# Patient Record
Sex: Female | Born: 1960 | Race: White | Hispanic: No | Marital: Married | Smoking: Former smoker
Health system: Southern US, Community
[De-identification: ages and names within clinical notes are randomized; demographics above are authoritative.]

## PROBLEM LIST (undated history)

## (undated) DIAGNOSIS — M1990 Unspecified osteoarthritis, unspecified site: Secondary | ICD-10-CM

## (undated) DIAGNOSIS — M5126 Other intervertebral disc displacement, lumbar region: Secondary | ICD-10-CM

## (undated) DIAGNOSIS — R002 Palpitations: Secondary | ICD-10-CM

## (undated) DIAGNOSIS — K2970 Gastritis, unspecified, without bleeding: Secondary | ICD-10-CM

## (undated) DIAGNOSIS — K635 Polyp of colon: Secondary | ICD-10-CM

## (undated) DIAGNOSIS — K219 Gastro-esophageal reflux disease without esophagitis: Secondary | ICD-10-CM

## (undated) DIAGNOSIS — E669 Obesity, unspecified: Secondary | ICD-10-CM

## (undated) DIAGNOSIS — Z6826 Body mass index (BMI) 26.0-26.9, adult: Secondary | ICD-10-CM

## (undated) DIAGNOSIS — R0781 Pleurodynia: Secondary | ICD-10-CM

## (undated) DIAGNOSIS — K589 Irritable bowel syndrome without diarrhea: Secondary | ICD-10-CM

## (undated) DIAGNOSIS — R609 Edema, unspecified: Secondary | ICD-10-CM

## (undated) HISTORY — PX: SPINE SURGERY: SHX786

## (undated) HISTORY — DX: Gastro-esophageal reflux disease without esophagitis: K21.9

## (undated) HISTORY — DX: Polyp of colon: K63.5

## (undated) HISTORY — PX: APPENDECTOMY: SHX54

## (undated) HISTORY — DX: Obesity, unspecified: E66.9

## (undated) HISTORY — PX: FOOT SURGERY: SHX648

## (undated) HISTORY — DX: Unspecified osteoarthritis, unspecified site: M19.90

## (undated) HISTORY — DX: Other intervertebral disc displacement, lumbar region: M51.26

## (undated) HISTORY — DX: Irritable bowel syndrome, unspecified: K58.9

## (undated) HISTORY — DX: Body mass index (BMI) 26.0-26.9, adult: Z68.26

## (undated) HISTORY — DX: Palpitations: R00.2

## (undated) HISTORY — DX: Edema, unspecified: R60.9

## (undated) HISTORY — DX: Pleurodynia: R07.81

## (undated) HISTORY — DX: Gastritis, unspecified, without bleeding: K29.70

## (undated) HISTORY — PX: BREAST CYST ASPIRATION: SHX578

---

## 1998-03-09 ENCOUNTER — Ambulatory Visit (HOSPITAL_COMMUNITY): Admission: RE | Admit: 1998-03-09 | Discharge: 1998-03-09 | Payer: Self-pay | Admitting: Neurosurgery

## 1998-03-09 ENCOUNTER — Encounter: Payer: Self-pay | Admitting: Neurosurgery

## 1999-12-21 ENCOUNTER — Other Ambulatory Visit: Admission: RE | Admit: 1999-12-21 | Discharge: 1999-12-21 | Payer: Self-pay | Admitting: Obstetrics and Gynecology

## 2000-07-10 ENCOUNTER — Ambulatory Visit (HOSPITAL_COMMUNITY): Admission: RE | Admit: 2000-07-10 | Discharge: 2000-07-10 | Payer: Self-pay | Admitting: Obstetrics and Gynecology

## 2000-07-10 ENCOUNTER — Encounter: Payer: Self-pay | Admitting: Obstetrics and Gynecology

## 2000-08-20 ENCOUNTER — Emergency Department (HOSPITAL_COMMUNITY): Admission: EM | Admit: 2000-08-20 | Discharge: 2000-08-20 | Payer: Self-pay | Admitting: Emergency Medicine

## 2001-01-03 ENCOUNTER — Other Ambulatory Visit: Admission: RE | Admit: 2001-01-03 | Discharge: 2001-01-03 | Payer: Self-pay | Admitting: Obstetrics and Gynecology

## 2002-06-12 ENCOUNTER — Encounter: Admission: RE | Admit: 2002-06-12 | Discharge: 2002-06-12 | Payer: Self-pay | Admitting: Obstetrics and Gynecology

## 2002-06-12 ENCOUNTER — Encounter: Payer: Self-pay | Admitting: Obstetrics and Gynecology

## 2002-07-31 ENCOUNTER — Encounter: Admission: RE | Admit: 2002-07-31 | Discharge: 2002-07-31 | Payer: Self-pay | Admitting: Obstetrics and Gynecology

## 2002-07-31 ENCOUNTER — Encounter: Payer: Self-pay | Admitting: Obstetrics and Gynecology

## 2003-04-01 ENCOUNTER — Encounter: Admission: RE | Admit: 2003-04-01 | Discharge: 2003-04-01 | Payer: Self-pay | Admitting: Family Medicine

## 2004-06-21 ENCOUNTER — Encounter: Admission: RE | Admit: 2004-06-21 | Discharge: 2004-06-21 | Payer: Self-pay | Admitting: Obstetrics and Gynecology

## 2005-08-18 ENCOUNTER — Encounter: Admission: RE | Admit: 2005-08-18 | Discharge: 2005-08-18 | Payer: Self-pay | Admitting: Family Medicine

## 2006-06-12 ENCOUNTER — Encounter: Admission: RE | Admit: 2006-06-12 | Discharge: 2006-06-12 | Payer: Self-pay | Admitting: Obstetrics and Gynecology

## 2007-05-20 ENCOUNTER — Encounter: Admission: RE | Admit: 2007-05-20 | Discharge: 2007-05-20 | Payer: Self-pay | Admitting: Family Medicine

## 2008-06-09 ENCOUNTER — Encounter: Admission: RE | Admit: 2008-06-09 | Discharge: 2008-06-09 | Payer: Self-pay | Admitting: Obstetrics and Gynecology

## 2010-05-12 ENCOUNTER — Other Ambulatory Visit: Payer: Self-pay | Admitting: Obstetrics and Gynecology

## 2010-05-12 DIAGNOSIS — Z1231 Encounter for screening mammogram for malignant neoplasm of breast: Secondary | ICD-10-CM

## 2010-06-14 ENCOUNTER — Ambulatory Visit
Admission: RE | Admit: 2010-06-14 | Discharge: 2010-06-14 | Disposition: A | Discharge status: Home / Self Care | Payer: BLUE CROSS/BLUE SHIELD | Source: Ambulatory Visit | Attending: Obstetrics and Gynecology | Admitting: Obstetrics and Gynecology

## 2010-06-14 DIAGNOSIS — Z1231 Encounter for screening mammogram for malignant neoplasm of breast: Secondary | ICD-10-CM

## 2011-05-24 ENCOUNTER — Other Ambulatory Visit: Payer: Self-pay | Admitting: Obstetrics and Gynecology

## 2011-05-24 DIAGNOSIS — Z1231 Encounter for screening mammogram for malignant neoplasm of breast: Secondary | ICD-10-CM

## 2011-06-15 ENCOUNTER — Ambulatory Visit
Admission: RE | Admit: 2011-06-15 | Discharge: 2011-06-15 | Disposition: A | Discharge status: Home / Self Care | Payer: BC Managed Care – PPO | Source: Ambulatory Visit | Attending: Obstetrics and Gynecology | Admitting: Obstetrics and Gynecology

## 2011-06-15 DIAGNOSIS — Z1231 Encounter for screening mammogram for malignant neoplasm of breast: Secondary | ICD-10-CM

## 2012-05-13 ENCOUNTER — Other Ambulatory Visit: Payer: Self-pay

## 2012-05-13 DIAGNOSIS — Z1231 Encounter for screening mammogram for malignant neoplasm of breast: Secondary | ICD-10-CM

## 2012-06-18 ENCOUNTER — Ambulatory Visit: Payer: BC Managed Care – PPO

## 2012-07-02 ENCOUNTER — Ambulatory Visit
Admission: RE | Admit: 2012-07-02 | Discharge: 2012-07-02 | Disposition: A | Discharge status: Home / Self Care | Payer: BC Managed Care – PPO | Source: Ambulatory Visit

## 2012-07-02 DIAGNOSIS — Z1231 Encounter for screening mammogram for malignant neoplasm of breast: Secondary | ICD-10-CM

## 2013-05-28 ENCOUNTER — Other Ambulatory Visit: Payer: Self-pay

## 2013-05-28 DIAGNOSIS — Z1231 Encounter for screening mammogram for malignant neoplasm of breast: Secondary | ICD-10-CM

## 2013-07-08 ENCOUNTER — Encounter (INDEPENDENT_AMBULATORY_CARE_PROVIDER_SITE_OTHER): Payer: Self-pay

## 2013-07-08 ENCOUNTER — Ambulatory Visit
Admission: RE | Admit: 2013-07-08 | Discharge: 2013-07-08 | Disposition: A | Discharge status: Home / Self Care | Payer: BC Managed Care – PPO | Source: Ambulatory Visit

## 2013-07-08 DIAGNOSIS — Z1231 Encounter for screening mammogram for malignant neoplasm of breast: Secondary | ICD-10-CM

## 2014-08-13 ENCOUNTER — Ambulatory Visit: Payer: Self-pay | Admitting: Podiatry

## 2015-01-14 ENCOUNTER — Other Ambulatory Visit: Payer: Self-pay

## 2015-01-14 DIAGNOSIS — Z1231 Encounter for screening mammogram for malignant neoplasm of breast: Secondary | ICD-10-CM

## 2015-02-09 ENCOUNTER — Ambulatory Visit
Admission: RE | Admit: 2015-02-09 | Discharge: 2015-02-09 | Disposition: A | Discharge status: Home / Self Care | Payer: BLUE CROSS/BLUE SHIELD | Source: Ambulatory Visit

## 2015-02-09 DIAGNOSIS — Z1231 Encounter for screening mammogram for malignant neoplasm of breast: Secondary | ICD-10-CM

## 2015-05-27 DIAGNOSIS — R6889 Other general symptoms and signs: Secondary | ICD-10-CM | POA: Insufficient documentation

## 2015-05-27 DIAGNOSIS — R0989 Other specified symptoms and signs involving the circulatory and respiratory systems: Secondary | ICD-10-CM | POA: Insufficient documentation

## 2015-05-27 DIAGNOSIS — J3089 Other allergic rhinitis: Secondary | ICD-10-CM | POA: Insufficient documentation

## 2015-05-27 DIAGNOSIS — J343 Hypertrophy of nasal turbinates: Secondary | ICD-10-CM | POA: Insufficient documentation

## 2015-07-20 ENCOUNTER — Ambulatory Visit: Payer: Self-pay | Admitting: Allergy and Immunology

## 2016-04-20 ENCOUNTER — Other Ambulatory Visit: Payer: Self-pay | Admitting: Obstetrics and Gynecology

## 2016-04-20 DIAGNOSIS — Z1231 Encounter for screening mammogram for malignant neoplasm of breast: Secondary | ICD-10-CM

## 2016-06-06 ENCOUNTER — Ambulatory Visit
Admission: RE | Admit: 2016-06-06 | Discharge: 2016-06-06 | Disposition: A | Discharge status: Home / Self Care | Payer: BLUE CROSS/BLUE SHIELD | Source: Ambulatory Visit | Attending: Obstetrics and Gynecology | Admitting: Obstetrics and Gynecology

## 2016-06-06 DIAGNOSIS — Z1231 Encounter for screening mammogram for malignant neoplasm of breast: Secondary | ICD-10-CM

## 2017-03-15 ENCOUNTER — Ambulatory Visit: Payer: BLUE CROSS/BLUE SHIELD | Admitting: Family Medicine

## 2017-03-15 ENCOUNTER — Encounter: Payer: Self-pay | Admitting: Family Medicine

## 2017-03-15 VITALS — BP 125/78 | HR 62 | Ht 66.5 in | Wt 181.5 lb

## 2017-03-15 DIAGNOSIS — Z8249 Family history of ischemic heart disease and other diseases of the circulatory system: Secondary | ICD-10-CM | POA: Insufficient documentation

## 2017-03-15 DIAGNOSIS — Z862 Personal history of diseases of the blood and blood-forming organs and certain disorders involving the immune mechanism: Secondary | ICD-10-CM | POA: Diagnosis not present

## 2017-03-15 DIAGNOSIS — Z833 Family history of diabetes mellitus: Secondary | ICD-10-CM

## 2017-03-15 DIAGNOSIS — E663 Overweight: Secondary | ICD-10-CM | POA: Diagnosis not present

## 2017-03-15 DIAGNOSIS — Z9049 Acquired absence of other specified parts of digestive tract: Secondary | ICD-10-CM | POA: Insufficient documentation

## 2017-03-15 DIAGNOSIS — Z8639 Personal history of other endocrine, nutritional and metabolic disease: Secondary | ICD-10-CM | POA: Insufficient documentation

## 2017-03-15 DIAGNOSIS — Z800 Family history of malignant neoplasm of digestive organs: Secondary | ICD-10-CM | POA: Insufficient documentation

## 2017-03-15 DIAGNOSIS — R6889 Other general symptoms and signs: Secondary | ICD-10-CM | POA: Diagnosis not present

## 2017-03-15 DIAGNOSIS — Z83438 Family history of other disorder of lipoprotein metabolism and other lipidemia: Secondary | ICD-10-CM

## 2017-03-15 DIAGNOSIS — R490 Dysphonia: Secondary | ICD-10-CM | POA: Diagnosis not present

## 2017-03-15 DIAGNOSIS — F1290 Cannabis use, unspecified, uncomplicated: Secondary | ICD-10-CM

## 2017-03-15 DIAGNOSIS — Z9889 Other specified postprocedural states: Secondary | ICD-10-CM | POA: Insufficient documentation

## 2017-03-15 DIAGNOSIS — Z809 Family history of malignant neoplasm, unspecified: Secondary | ICD-10-CM | POA: Diagnosis not present

## 2017-03-15 DIAGNOSIS — R0989 Other specified symptoms and signs involving the circulatory and respiratory systems: Secondary | ICD-10-CM

## 2017-03-15 NOTE — Progress Notes (Signed)
New patient office visit note:  Impression and Recommendations:    1. Chronic throat clearing   2. Hoarseness   3. Family history of diabetes mellitus (DM)- Mom,bro- early 71's)   4. Family history of premature coronary heart disease- brother in his early 81s.- heavy smoker   5.  family history of throat and lung ca in dad, throat cancer in brother.    6. Family history of combined hyperlipidemia   7. Family history of hypertension   8. History of back surgery- 2000   9. Status post appendectomy   10. H/O mixed hyperlipidemia   11. H/O iron deficiency anemia   12. Marijuana use, continuous   13. Overweight (BMI 25.0-29.9)     1. Chronic throat clearing: At pt's request, we will refer her to [facility] ENT. Call the office here if you do not hear anything about a referral in the next week.  2. FMHx DM: 3. FMHx CAD: 4. FMHx throat/lung CA. 2 first degree relatives with h/o throat cancer. Will refer pt per her request to [facility] ENT. 5. FMHx: HLD 6. FMHx: HTN 7. PSHx: Back surgery 2000 8. S/p Appendectomy: 9. PMHx: Mixed hyperlipidemia: Pt instructed to continue her exercise and dietary regimen.  10. PMHx: iron deficiency anemia- 11. Hoarseness: Pt prefers to be referred to [facility]NT. Call the office if you do not hear anything about a referral in the next week. 12. Marijuana use, continuous- 13. Overweight: dietary and exercise guidelines discussed. Continue watching what you eat and exercising weekly. Encouraged pt to lose weight.  -Instructed pt to contact her other healthcare providers and have them send her health information to the office for our records.  -Will check labs today. 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    Education and routine counseling performed. Handouts provided.  No orders of the  defined types were placed in this encounter.   No orders of the defined types were placed in this encounter.   Gross side effects, risk and benefits, and alternatives of medications discussed with patient.  Patient is aware that all medications have potential side effects and we are unable to predict every side effect or drug-drug interaction that may occur.  Expresses verbal understanding and consents to current therapy plan and treatment regimen.  No Follow-up on file.  Please see AVS handed out to patient at the end of our visit for further patient instructions/ counseling done pertaining to today's office visit.    Note: This document was prepared using Dragon voice recognition software and may include unintentional dictation errors.     This document serves as a record of services personally performed by Thomasene Lot, DO. It was created on her behalf by Thelma Barge, a trained medical scribe. The creation of this record is based on the scribe's personal observations and the provider's statements to them.   ........I have reviewed the above medical documentation for accuracy and completeness and I concur.  Thomasene Lot 03/15/17 5:09 PM   ----------------------------------------------------------------------------------------------------------------------    Subjective:    Chief complaint:   Chief Complaint  Patient presents with  . Establish Care     HPI: Lindsey Lin is a pleasant 57 y.o. female who presents to [facility] today to review their medical history with me  and establish care.   I asked the patient to review their chronic problem list with me to ensure everything was updated and accurate.    All recent office visits with other providers, any medical records that patient brought in etc  - I reviewed today.     We asked pt to get [place] their medical records from [facility] providers/ specialists that they had seen within the past 3-5  years- if they are in private practice and/or do not work for [employer], [facility], [facility], [facility] or [facility] owned practice.  Told them to call their specialists to clarify this if they are not sure.   -She states she does not want to come more than once a year other than a yearly wellness visit because of her schedule and her taking care of her elderly mother.  Personal information She is married, has no kids, previously had dogs but now has no dogs. Pt is from [place], been here in [place] 28 years. 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 Pt states she has been relatively healthy and saw her previous PCP 1x a year.   Pt's other providers:  Dr. Ambrose Mantle her gynecologist, but this was over 2 years ago. She previously saw him once/year. She gets a mammogram yearly. Her women's health issues are being closely followed by him.   Dr. Jearld Fenton at [facility] is ENT but she is not going back to him again.   Dr. Donzetta Starch, dermatologist.  Dr. Loreta Ave, GI doc.   Dr. Claud Kelp previous visit was 5 years ago.  Pt's husband smokes cigarettes but she takes occasionally 1 drag or two after she lights her husband's cigarette. <1/day for 10 year. She is an occasional drinker.   Pt has had stomach issues her whole life but no diagnosis. She states alcohol makes it worse, but drinks occasionally as a result. She smokes marijuana on the weekends because she cannot drink, this has been for years "since college".   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 She goes to weight watcher regularly, eats healthily, but she does indulge in sweets on occasion.   FMHx:  DM: Mom, brother (age 80) Heart disease: brother (age 37) CA: Brother (throat),  father (lung and throat), Skin in several relatives, squamous and unknown class Alcoholism: Paternal grandfather  HLD: Dad HTN: Mom PSHx: Back surgery in 2000, appendectomy   PMHx:  She states she had elevated triglycerides at one point but she started eating healthier and exercising and this resolved. She also states she had a previous h/o anemia and vertigo. She takes meclizine for her vertigo.   Throat: This is a chronic issue. She repeatedly has to clear her throat and has a hoarseness in her voice. She states her symptoms are worse in her car. She states further that she believes there is mold at work, and there is a leak in her work building when it rains. She states a coworker also has a similar throat-clearing problem now.   Pt saw Dr. Jearld Fenton who diagnosed her with reflux. She then went to Dr. Loreta Ave, her GI doctor who prescribed her prilosec which mildly improved her symptoms but then they worsened.  She denies foreign body sensation. She has chronic post nasal drip.   Wt Readings from Last 3 Encounters:  03/15/17 181 lb 8 oz (82.3 kg)   BP Readings from Last 3 Encounters:  03/15/17 125/78   Pulse Readings from Last 3 Encounters:  03/15/17 62   BMI Readings from Last 3 Encounters:  03/15/17 28.86 kg/m    Patient Care Team    Relationship Specialty Notifications Start End  Thomasene Lotpalski, Altair Appenzeller, DO PCP - General Family Medicine  03/15/17     Social History   Substance and Sexual Activity  Drug Use Not on file     Social History   Substance and Sexual Activity  Alcohol Use Not on file     Social History   Tobacco Use  Smoking Status Not on file     Current Meds  Medication Sig  . Multiple Vitamin (MULTIVITAMIN) capsule Take 1 capsule by mouth daily.  . Omega-3 Fatty Acids (FISH OIL) 1200 MG CAPS Take 1 capsule by mouth.  Marland Kitchen. omeprazole (PRILOSEC) 40 MG capsule Take 1 capsule by mouth daily.    Allergies: Sulfa antibiotics   Review of Systems    Constitutional: Negative for chills, diaphoresis, fever, malaise/fatigue and weight loss.  HENT: Negative for congestion, sore throat and tinnitus.   Eyes: Negative for blurred vision, double vision and photophobia.  Respiratory: Negative for cough and wheezing.   Cardiovascular: Negative for chest pain and palpitations.  Gastrointestinal: Negative for blood in stool, diarrhea, nausea and vomiting.  Genitourinary: Negative for dysuria, frequency and urgency.  Musculoskeletal: Negative for joint pain and myalgias.  Skin: Negative for itching and rash.  Neurological: Negative for dizziness, focal weakness, weakness and headaches.  Endo/Heme/Allergies: Negative for environmental allergies and polydipsia. Does not bruise/bleed easily.  Psychiatric/Behavioral: Negative for depression and memory loss. The patient is not nervous/anxious and does not have insomnia.      Objective:   Blood pressure 125/78, pulse 62, height 5' 6.5" (1.689 m), weight 181 lb 8 oz (82.3 kg), SpO2 99 %. Body mass index is 28.86 kg/m. General: Well Developed, well nourished, and in no acute distress.  Neuro: Alert and oriented x3, extra-ocular muscles intact, sensation grossly intact.  HEENT:/AT, PERRLA, neck supple, No carotid bruits. Normal Skin: no gross rashes  Cardiac: Regular rate and rhythm Respiratory: Essentially clear to auscultation bilaterally. Not using accessory muscles, speaking in full sentences.  Abdominal: not grossly distended Musculoskeletal: Ambulates w/o diff, FROM * 4 ext.  Vasc: less 2 sec cap RF, warm and pink  Psych:  No HI/SI, judgement and insight good, Euthymic mood. Full Affect.    No results found for this or any previous visit (from the past 2160 hour(s)).

## 2017-03-15 NOTE — Patient Instructions (Signed)
We are sending you to ear nose and throat Joselyn Glassman at the front desk is the one who does these referral so if you have not heard anything by early next week please call here and speak with him about that.  We will get your blood work back probably early next week.  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  -  American Heart Association [facility]) guidelines for exercise : If you are in good health, without any medical conditions, you should engage in 150 minutes of moderate intensity aerobic activity per week.  This means you should be huffing and puffing throughout your workout.   Engaging in regular exercise will improve brain function and memory, as well as improve mood, boost immune system and help with weight management.  As well as the other, more well-known effects of exercise such as decreasing blood sugar levels, decreasing blood pressure,  and decreasing bad cholesterol levels/ increasing good cholesterol levels.     -  The AHA strongly endorses consumption of a diet that contains a variety of foods from all the food categories with an emphasis on fruits and vegetables; fat-free and low-fat dairy products; cereal and grain products; legumes and nuts; and fish, poultry, and/or extra lean meats.    Excessive food intake, especially of foods high in saturated and trans fats, sugar, and salt, should be avoided.    Adequate water intake of roughly 1/2 of your weight in pounds, should equal the ounces of water per day you should drink.  So for instance, if you're 200 pounds, that would be 100 ounces of water per day.      Mediterranean Diet  Why follow it? Research shows. . Those who follow the Mediterranean diet have a reduced risk of heart disease  . The diet is associated with a reduced  incidence of Parkinson's and Alzheimer's diseases . People following the diet may have longer life expectancies and lower rates of chronic diseases  . The Dietary Guidelines for Americans recommends the Mediterranean diet as an eating plan to promote health and prevent disease  What Is the Mediterranean Diet?  . Healthy eating plan based on typical foods and recipes of Mediterranean-style cooking . The diet is primarily a plant based diet; these foods should make up a majority of meals   Starches - Plant based foods should make up a majority of meals - They are an important sources of vitamins, minerals, energy, antioxidants, and fiber - Choose whole grains, foods high in fiber and minimally processed items  - Typical grain sources include wheat, oats, barley, corn, brown rice, bulgar, farro, millet, polenta, couscous  - Various types of beans include chickpeas, lentils, fava beans, black beans, white beans   Fruits  Veggies - Large quantities of antioxidant rich fruits & veggies; 6 or more servings  - Vegetables can be eaten raw or lightly drizzled with oil and cooked  - Vegetables common to the traditional Mediterranean Diet include: artichokes, arugula, beets, broccoli, brussel sprouts, cabbage, carrots, celery, collard greens, cucumbers, eggplant, kale, leeks, lemons, lettuce, mushrooms, okra, onions, peas, peppers, potatoes, pumpkin, radishes, rutabaga, shallots, spinach, sweet potatoes, turnips, zucchini - Fruits common to the Mediterranean Diet include: apples, apricots, avocados, cherries, clementines, dates, figs,  grapefruits, grapes, melons, nectarines, oranges, peaches, pears, pomegranates, strawberries, tangerines  Fats - Replace butter and margarine with healthy oils, such as olive oil, canola oil, and tahini  - Limit nuts to no more than a handful a day  - Nuts include walnuts, almonds, pecans, pistachios, pine nuts  - Limit or avoid candied, honey roasted or heavily salted nuts -  Olives are central to the Mediterranean diet - can be eaten whole or used in a variety of dishes   Meats Protein - Limiting red meat: no more than a few times a month - When eating red meat: choose lean cuts and keep the portion to the size of deck of cards - Eggs: approx. 0 to 4 times a week  - Fish and lean poultry: at least 2 a week  - Healthy protein sources include, chicken, [place], lean beef, lamb - Increase intake of seafood such as tuna, salmon, trout, mackerel, shrimp, scallops - Avoid or limit high fat processed meats such as sausage and bacon  Dairy - Include moderate amounts of low fat dairy products  - Focus on healthy dairy such as fat free yogurt, skim milk, low or reduced fat cheese - Limit dairy products higher in fat such as whole or 2% milk, cheese, ice cream  Alcohol - Moderate amounts of red wine is ok  - No more than 5 oz daily for women (all ages) and men older than age 71  - No more than 10 oz of wine daily for men younger than 75  Other - Limit sweets and other desserts  - Use herbs and spices instead of salt to flavor foods  - Herbs and spices common to the traditional Mediterranean Diet include: basil, bay leaves, chives, cloves, cumin, fennel, garlic, lavender, marjoram, mint, oregano, parsley, pepper, rosemary, sage, savory, sumac, tarragon, thyme   It's not just a diet, it's a lifestyle:  . The Mediterranean diet includes lifestyle factors typical of those in the region  . Foods, drinks and meals are best eaten with others and savored . Daily physical activity is important for overall good health . This could be strenuous exercise like running and aerobics . This could also be more leisurely activities such as walking, housework, yard-work, or taking the stairs . Moderation is the key; a balanced and healthy diet accommodates most foods and drinks . Consider portion sizes and frequency of consumption of certain foods   Meal Ideas & Options:  . Breakfast:   o Whole wheat toast or whole wheat English muffins with peanut butter & hard boiled egg o Steel cut oats topped with apples & cinnamon and skim milk  o Fresh fruit: banana, strawberries, melon, berries, peaches  o Smoothies: strawberries, bananas, greek yogurt, peanut butter o Low fat greek yogurt with blueberries and granola  o Egg white omelet with spinach and mushrooms o Breakfast couscous: whole wheat couscous, apricots, skim milk, cranberries  . Sandwiches:  o Hummus and grilled vegetables (peppers, zucchini, squash) on whole wheat bread   o Grilled chicken on whole wheat pita with lettuce, tomatoes, cucumbers or tzatziki  o Tuna salad on whole wheat bread: tuna salad made with greek yogurt, olives, red peppers, capers, green onions o Garlic rosemary lamb pita: lamb sauted with garlic, rosemary, salt & pepper; add lettuce, cucumber, greek yogurt to pita - flavor with lemon juice and black pepper  . Seafood:  o Mediterranean grilled salmon, seasoned with garlic, basil, parsley, lemon juice and black pepper o Shrimp, lemon,  and spinach whole-grain pasta salad made with low fat greek yogurt  o Seared scallops with lemon orzo  o Seared tuna steaks seasoned salt, pepper, coriander topped with tomato mixture of olives, tomatoes, olive oil, minced garlic, parsley, green onions and cappers  . Meats:  o Herbed greek chicken salad with kalamata olives, cucumber, feta  o Red bell peppers stuffed with spinach, bulgur, lean ground beef (or lentils) & topped with feta   o Kebabs: skewers of chicken, tomatoes, onions, zucchini, squash  o [place]urkey burgers: made with red onions, mint, dill, lemon juice, feta cheese topped with roasted red peppers . Vegetarian o Cucumber salad: cucumbers, artichoke hearts, celery, red onion, feta cheese, tossed in olive oil & lemon juice  o Hummus and whole grain pita points with a greek salad (lettuce, tomato, feta, olives, cucumbers, red onion) o Lentil soup with  celery, carrots made with vegetable broth, garlic, salt and pepper  o Tabouli salad: parsley, bulgur, mint, scallions, cucumbers, tomato, radishes, lemon juice, olive oil, salt and pepper.

## 2017-03-16 LAB — CBC WITH DIFFERENTIAL/PLATELET
BASOS ABS: 0 10*3/uL (ref 0.0–0.2)
Basos: 0 %
EOS (ABSOLUTE): 0.1 10*3/uL (ref 0.0–0.4)
Eos: 1 %
HEMATOCRIT: 41.3 % (ref 34.0–46.6)
HEMOGLOBIN: 13.9 g/dL (ref 11.1–15.9)
Immature Grans (Abs): 0 10*3/uL (ref 0.0–0.1)
Immature Granulocytes: 0 %
LYMPHS ABS: 2.2 10*3/uL (ref 0.7–3.1)
Lymphs: 33 %
MCH: 31.9 pg (ref 26.6–33.0)
MCHC: 33.7 g/dL (ref 31.5–35.7)
MCV: 95 fL (ref 79–97)
MONOCYTES: 6 %
MONOS ABS: 0.4 10*3/uL (ref 0.1–0.9)
NEUTROS ABS: 4 10*3/uL (ref 1.4–7.0)
Neutrophils: 60 %
Platelets: 207 10*3/uL (ref 150–379)
RBC: 4.36 x10E6/uL (ref 3.77–5.28)
RDW: 12.6 % (ref 12.3–15.4)
WBC: 6.7 10*3/uL (ref 3.4–10.8)

## 2017-03-16 LAB — COMPREHENSIVE METABOLIC PANEL
ALBUMIN: 4.6 g/dL (ref 3.5–5.5)
ALK PHOS: 76 IU/L (ref 39–117)
ALT: 14 IU/L (ref 0–32)
AST: 20 IU/L (ref 0–40)
Albumin/Globulin Ratio: 1.7 (ref 1.2–2.2)
BUN / CREAT RATIO: 12 (ref 9–23)
BUN: 8 mg/dL (ref 6–24)
Bilirubin Total: 0.4 mg/dL (ref 0.0–1.2)
CO2: 22 mmol/L (ref 20–29)
Calcium: 9.8 mg/dL (ref 8.7–10.2)
Chloride: 103 mmol/L (ref 96–106)
Creatinine, Ser: 0.68 mg/dL (ref 0.57–1.00)
GFR calc Af Amer: 113 mL/min/{1.73_m2} (ref >59)
GFR calc non Af Amer: 98 mL/min/{1.73_m2} (ref >59)
GLUCOSE: 90 mg/dL (ref 65–99)
Globulin, Total: 2.7 g/dL (ref 1.5–4.5)
Potassium: 4.4 mmol/L (ref 3.5–5.2)
SODIUM: 142 mmol/L (ref 134–144)
Total Protein: 7.3 g/dL (ref 6.0–8.5)

## 2017-03-16 LAB — HEPATITIS C ANTIBODY

## 2017-03-16 LAB — HIV ANTIBODY (ROUTINE TESTING W REFLEX): HIV SCREEN 4TH GENERATION: NONREACTIVE

## 2017-03-16 LAB — LIPID PANEL
CHOL/HDL RATIO: 2.4 ratio (ref 0.0–4.4)
Cholesterol, Total: 198 mg/dL (ref 100–199)
HDL: 81 mg/dL (ref >39)
LDL Calculated: 99 mg/dL (ref 0–99)
Triglycerides: 88 mg/dL (ref 0–149)
VLDL Cholesterol Cal: 18 mg/dL (ref 5–40)

## 2017-03-16 LAB — VITAMIN D 25 HYDROXY (VIT D DEFICIENCY, FRACTURES): VIT D 25 HYDROXY: 38.4 ng/mL (ref 30.0–100.0)

## 2017-03-16 LAB — HEMOGLOBIN A1C
Est. average glucose Bld gHb Est-mCnc: 105 mg/dL
HEMOGLOBIN A1C: 5.3 % (ref 4.8–5.6)

## 2017-05-07 ENCOUNTER — Other Ambulatory Visit: Payer: Self-pay | Admitting: Obstetrics and Gynecology

## 2017-05-07 DIAGNOSIS — Z1231 Encounter for screening mammogram for malignant neoplasm of breast: Secondary | ICD-10-CM

## 2017-06-14 ENCOUNTER — Ambulatory Visit
Admission: RE | Admit: 2017-06-14 | Discharge: 2017-06-14 | Disposition: A | Discharge status: Home / Self Care | Payer: BLUE CROSS/BLUE SHIELD | Source: Ambulatory Visit | Attending: Obstetrics and Gynecology | Admitting: Obstetrics and Gynecology

## 2017-06-14 DIAGNOSIS — Z1231 Encounter for screening mammogram for malignant neoplasm of breast: Secondary | ICD-10-CM

## 2018-09-05 ENCOUNTER — Other Ambulatory Visit: Payer: Self-pay | Admitting: Obstetrics and Gynecology

## 2018-09-05 DIAGNOSIS — Z1231 Encounter for screening mammogram for malignant neoplasm of breast: Secondary | ICD-10-CM

## 2018-10-22 ENCOUNTER — Other Ambulatory Visit: Payer: Self-pay

## 2018-10-22 ENCOUNTER — Ambulatory Visit
Admission: RE | Admit: 2018-10-22 | Discharge: 2018-10-22 | Disposition: A | Discharge status: Home / Self Care | Payer: BLUE CROSS/BLUE SHIELD | Source: Ambulatory Visit | Attending: Obstetrics and Gynecology | Admitting: Obstetrics and Gynecology

## 2018-10-22 DIAGNOSIS — Z1231 Encounter for screening mammogram for malignant neoplasm of breast: Secondary | ICD-10-CM

## 2019-11-19 ENCOUNTER — Other Ambulatory Visit: Payer: Self-pay | Admitting: Obstetrics and Gynecology

## 2019-11-19 DIAGNOSIS — Z1231 Encounter for screening mammogram for malignant neoplasm of breast: Secondary | ICD-10-CM

## 2019-12-05 ENCOUNTER — Other Ambulatory Visit: Payer: Self-pay

## 2019-12-05 ENCOUNTER — Ambulatory Visit
Admission: RE | Admit: 2019-12-05 | Discharge: 2019-12-05 | Disposition: A | Discharge status: Home / Self Care | Payer: BC Managed Care – PPO | Source: Ambulatory Visit | Attending: Obstetrics and Gynecology | Admitting: Obstetrics and Gynecology

## 2019-12-05 DIAGNOSIS — Z1231 Encounter for screening mammogram for malignant neoplasm of breast: Secondary | ICD-10-CM

## 2021-04-21 ENCOUNTER — Other Ambulatory Visit: Payer: Self-pay | Admitting: Obstetrics and Gynecology

## 2021-04-21 DIAGNOSIS — Z1231 Encounter for screening mammogram for malignant neoplasm of breast: Secondary | ICD-10-CM

## 2021-05-03 ENCOUNTER — Ambulatory Visit
Admission: RE | Admit: 2021-05-03 | Discharge: 2021-05-03 | Disposition: A | Discharge status: Home / Self Care | Payer: BC Managed Care – PPO | Source: Ambulatory Visit | Attending: Obstetrics and Gynecology | Admitting: Obstetrics and Gynecology

## 2021-05-03 DIAGNOSIS — Z1231 Encounter for screening mammogram for malignant neoplasm of breast: Secondary | ICD-10-CM

## 2022-01-24 NOTE — Progress Notes (Unsigned)
Cardiology Office Note:    Date:  01/24/2022   ID:  Lindsey Lin, DOB 1960/12/13, MRN [id number]  PCP:  No primary care provider on file.   [facility] Providers Cardiologist:  None    Referring MD: Ailene Ravel, MD    History of Present Illness:    Lindsey Lin is a 60 y.o. female with a hx of GERD, HLD and arthritis who was referred by Dr. Nathanial Rancher for further evaluation of palpitations.   Patient seen by Dr. Nathanial Rancher on 12/23/21. Note reviewed. Patient reported increased frequency of palpitations mainly at night. She is now referred to Cardiology for further evaluation.  Today, ***    Past Medical History:  Diagnosis Date   Acid reflux    Arthritis    BMI 26.0-26.9,adult    Colon polyps    Edema    Gastritis    GERD (gastroesophageal reflux disease)    Herniated intervertebral disc of lumbar spine    IBS (irritable bowel syndrome)    Obesity    Palpitations    Rib pain     Past Surgical History:  Procedure Laterality Date   APPENDECTOMY     BREAST CYST ASPIRATION Right    approx 2 years ago    FOOT SURGERY     SPINE SURGERY      Current Medications: No outpatient medications have been marked as taking for the 01/25/22 encounter (Appointment) with Meriam Sprague, MD.     Allergies:   Sulfa antibiotics   Social History   Socioeconomic History   Marital status: Married    Spouse name: Not on file   Number of children: Not on file   Years of education: Not on file   Highest education level: Not on file  Occupational History   Not on file  Tobacco Use   Smoking status: Former    Types: Cigarettes    Quit date: 1989    Years since quitting: 34.9   Smokeless tobacco: Never  Vaping Use   Vaping Use: Never used  Substance and Sexual Activity   Alcohol use: Yes    Alcohol/week: 1.0 - 2.0 standard drink of alcohol    Types: 1 - 2 Standard drinks or equivalent per week   Drug use: No   Sexual activity: Yes    Birth  control/protection: None  Other Topics Concern   Not on file  Social History Narrative   Not on file   Social Determinants of Health   Financial Resource Strain: Not on file  Food Insecurity: Not on file  Transportation Needs: Not on file  Physical Activity: Not on file  Stress: Not on file  Social Connections: Not on file     Family History: The patient's ***family history includes Cancer in her brother, father, and sister; Diabetes in her brother, maternal grandfather, and sister; Heart disease in her brother, father, and maternal grandfather; Miscarriages / [place] in her maternal grandmother. There is no history of Breast cancer.  ROS:   Please see the history of present illness.    *** All other systems reviewed and are negative.  EKGs/Labs/Other Studies Reviewed:    The following studies were reviewed today: ***  EKG:  EKG is *** ordered today.  The ekg ordered today demonstrates ***  Recent Labs: No results found for requested labs within last 365 days.  Recent Lipid Panel    Component Value Date/Time   CHOL 198 03/15/2017 1156   TRIG 88 03/15/2017  1156   HDL 81 03/15/2017 1156   CHOLHDL 2.4 03/15/2017 1156   LDLCALC 99 03/15/2017 1156     Risk Assessment/Calculations:   {Does this patient have ATRIAL FIBRILLATION?:[id number]}  No BP recorded.  {Refresh Note OR Click here to enter BP  :1}***         Physical Exam:    VS:  There were no vitals taken for this visit.    Wt Readings from Last 3 Encounters:  03/15/17 181 lb 8 oz (82.3 kg)     GEN: *** Well nourished, well developed in no acute distress HEENT: Normal NECK: No JVD; No carotid bruits LYMPHATICS: No lymphadenopathy CARDIAC: ***RRR, no murmurs, rubs, gallops RESPIRATORY:  Clear to auscultation without rales, wheezing or rhonchi  ABDOMEN: Soft, non-tender, non-distended MUSCULOSKELETAL:  No edema; No deformity  SKIN: Warm and dry NEUROLOGIC:  Alert and oriented x 3 PSYCHIATRIC:   Normal affect   ASSESSMENT:    No diagnosis found. PLAN:    In order of problems listed above:  #Palpitations: Patient notes intermittent palpitations mainly at night but can occur during the day. Symptoms last for seconds up to 5min at a time. No associated chest pain, lightheadedness, dizziness or syncope.  -Check zio monitor  -Increase hydration -Cut back on caffeine  -PO Mag at night      {Are you ordering a CV Procedure (e.g. stress test, cath, DCCV, TEE, etc)?   Press F2        :[id number]    Medication Adjustments/Labs and Tests Ordered: Current medicines are reviewed at length with the patient today.  Concerns regarding medicines are outlined above.  No orders of the defined types were placed in this encounter.  No orders of the defined types were placed in this encounter.   There are no Patient Instructions on file for this visit.   Signed, Freada Bergeron, MD  01/24/2022 9:49 AM    [place]

## 2022-01-25 ENCOUNTER — Ambulatory Visit (INDEPENDENT_AMBULATORY_CARE_PROVIDER_SITE_OTHER): Payer: BC Managed Care – PPO

## 2022-01-25 ENCOUNTER — Telehealth: Payer: Self-pay | Admitting: *Deleted

## 2022-01-25 ENCOUNTER — Ambulatory Visit: Payer: BC Managed Care – PPO | Attending: Cardiology | Admitting: Cardiology

## 2022-01-25 ENCOUNTER — Encounter: Payer: Self-pay | Admitting: Cardiology

## 2022-01-25 VITALS — BP 102/60 | HR 72 | Ht 66.5 in | Wt 205.8 lb

## 2022-01-25 DIAGNOSIS — R002 Palpitations: Secondary | ICD-10-CM

## 2022-01-25 DIAGNOSIS — E785 Hyperlipidemia, unspecified: Secondary | ICD-10-CM | POA: Diagnosis not present

## 2022-01-25 NOTE — Progress Notes (Signed)
Cardiology Office Note:    Date:  01/25/2022   ID:  Lindsey Lin, DOB January 01, 1961, MRN [id number]  PCP:  No primary care provider on file.   [facility] Providers Cardiologist:  None    Referring MD: Leonides Sake, MD    History of Present Illness:    Lindsey Lin is a 61 y.o. female with a hx of GERD, HLD and arthritis who was referred by Dr. Lisbeth Ply for further evaluation of palpitations.   Patient seen by Dr. Lisbeth Ply on 12/23/21. Note reviewed. Patient reported increased frequency of palpitations mainly at night. She is now referred to Cardiology for further evaluation.  Today, she presents with concerns of rapid palpitations. She states that she has had these her whole life but noticed an increase in frequency the week of Halloween this year. At that time she had these palpitations every night for x4-5 days. She states that she typically only notices these palpitations at night. When they first began they were severe enough to keep her awake. The palpitations have never lasted for several hours, but could last up to 30 minutes at a time.  She denies any shortness of breath, but when she is thinking about her breathing or anxious she does feel that she needs to take a deep breath. She notes chronic issues with dependent BLE edema.  She drinks coffee in the mornings but denies any other caffeine intake. She cut her caffeine intake in half with improvement of her palpitations. She also admits to drinking less water than she thinks she should.  She reports having intermittent central chest pain for the last x2-3 months. She feels that this is random in onset but has occurred while walking on the treadmill and when she is stressed. The pain may also occur at rest. She denies any difficulty with exertional activities. Even when she has the chest pain she is able to exercise through this and it resolves without needing to rest.  She notes accompanying light-headedness but this is  chronic. The light-headedness does not occur alongside the palpitations. She notes a history of vertigo. She denies any syncopal episodes.  She denies any smoking or alcohol use. She does occasionally smoke marijuana socially or when she is anxious. She does not notice any worsening of her palpitations with this.  Her mother had a pacemaker placed and her brother has CAD but is a heavy smoker.  Past Medical History:  Diagnosis Date   Acid reflux    Arthritis    BMI 26.0-26.9,adult    Colon polyps    Edema    Gastritis    GERD (gastroesophageal reflux disease)    Herniated intervertebral disc of lumbar spine    IBS (irritable bowel syndrome)    Obesity    Palpitations    Rib pain     Past Surgical History:  Procedure Laterality Date   APPENDECTOMY     BREAST CYST ASPIRATION Right    approx 2 years ago    FOOT SURGERY     SPINE SURGERY      Current Medications: Current Meds  Medication Sig   Lactobacillus (PROBIOTIC ACIDOPHILUS PO) Take 1 capsule by mouth daily.   metroNIDAZOLE (METROGEL) 0.75 % gel Apply 1 Application topically daily.   Multiple Vitamin (MULTIVITAMIN) capsule Take 1 capsule by mouth daily.   Omega-3 Fatty Acids (FISH OIL) 1200 MG CAPS Take 1 capsule by mouth.   omeprazole (PRILOSEC) 40 MG capsule Take 1 capsule by mouth daily.  Allergies:   Sulfa antibiotics   Social History   Socioeconomic History   Marital status: Married    Spouse name: Not on file   Number of children: Not on file   Years of education: Not on file   Highest education level: Not on file  Occupational History   Not on file  Tobacco Use   Smoking status: Former    Types: Cigarettes    Quit date: 1989    Years since quitting: 34.9   Smokeless tobacco: Never  Vaping Use   Vaping Use: Never used  Substance and Sexual Activity   Alcohol use: Yes    Alcohol/week: 1.0 - 2.0 standard drink of alcohol    Types: 1 - 2 Standard drinks or equivalent per week   Drug use: No    Sexual activity: Yes    Birth control/protection: None  Other Topics Concern   Not on file  Social History Narrative   Not on file   Social Determinants of Health   Financial Resource Strain: Not on file  Food Insecurity: Not on file  Transportation Needs: Not on file  Physical Activity: Not on file  Stress: Not on file  Social Connections: Not on file     Family History: The patient's family history includes Cancer in her brother, father, and sister; Diabetes in her brother, maternal grandfather, and sister; Heart disease in her brother, father, and maternal grandfather; Miscarriages / [place] in her maternal grandmother. There is no history of Breast cancer.  ROS:   Please see the history of present illness.    Review of Systems  Constitutional:  Negative for chills and fever.  HENT:  Negative for nosebleeds and tinnitus.   Eyes:  Negative for blurred vision and pain.  Respiratory:  Negative for cough, hemoptysis, shortness of breath and stridor.   Cardiovascular:  Positive for chest pain, palpitations and leg swelling. Negative for orthopnea, claudication and PND.  Gastrointestinal:  Negative for blood in stool, diarrhea, nausea and vomiting.  Genitourinary:  Negative for dysuria and hematuria.  Musculoskeletal:  Negative for falls.  Neurological:  Negative for dizziness, loss of consciousness and headaches.  Psychiatric/Behavioral:  Negative for depression, hallucinations and substance abuse. The patient is nervous/anxious. The patient does not have insomnia.      EKGs/Labs/Other Studies Reviewed:    The following studies were reviewed today: N/A  EKG:  EKG ordered today, 01/25/22.  The ekg ordered today demonstrates NSR, LVH, poor R wave progression with a heart rate of 72bpm.  Recent Labs: No results found for requested labs within last 365 days.  Recent Lipid Panel    Component Value Date/Time   CHOL 198 03/15/2017 1156   TRIG 88 03/15/2017 1156   HDL 81  03/15/2017 1156   CHOLHDL 2.4 03/15/2017 1156   LDLCALC 99 03/15/2017 1156     Risk Assessment/Calculations:                Physical Exam:    VS:  BP 102/60   Pulse 72   Ht 5' 6.5" (1.689 m)   Wt 205 lb 12.8 oz (93.4 kg)   SpO2 98%   BMI 32.72 kg/m     Wt Readings from Last 3 Encounters:  01/25/22 205 lb 12.8 oz (93.4 kg)  03/15/17 181 lb 8 oz (82.3 kg)     GEN: Well nourished, well developed in no acute distress HEENT: Normal NECK: No JVD; No carotid bruits LYMPHATICS: No lymphadenopathy CARDIAC: RRR, no murmurs, rubs,  gallops RESPIRATORY:  Clear to auscultation without rales, wheezing or rhonchi  ABDOMEN: Soft, non-tender, non-distended MUSCULOSKELETAL:  No edema; No deformity  SKIN: Warm and dry NEUROLOGIC:  Alert and oriented x 3 PSYCHIATRIC:  Normal affect   ASSESSMENT:    1. Palpitations   2. Hyperlipidemia, unspecified hyperlipidemia type    PLAN:    In order of problems listed above:  #Palpitations: Patient notes intermittent palpitations mainly at night but can occur during the day. Symptoms last for seconds up to at a time. No associated chest pain, lightheadedness, dizziness or syncope. Will check zio monitor for further evaluation. -Check zio monitor  -Increase hydration -Cut back on caffeine  -PO Mag at night  #Atypical Chest Pain: Patient notes intermittent episodes of chest heaviness that occurs for a couple seconds at a time both at rest and with exertion. No known triggers. Symptoms atypical in nature and she is overall active without issues. Will check Ca score for risk stratification. Declined TTE for now. -Check Ca score          Medication Adjustments/Labs and Tests Ordered: Current medicines are reviewed at length with the patient today.  Concerns regarding medicines are outlined above.  Orders Placed This Encounter  Procedures   CT CARDIAC SCORING (SELF PAY ONLY)   LONG TERM MONITOR (3-14 DAYS)   EKG 12-Lead   No  orders of the defined types were placed in this encounter.   Patient Instructions  Medication Instructions:   Your physician recommends that you continue on your current medications as directed. Please refer to the Current Medication list given to you today.  *If you need a refill on your cardiac medications before your next appointment, please call your pharmacy*   Testing/Procedures:  CARDIAC CALCIUM SCORE (SELF PAY)    ZIO XT- Long Term Monitor Instructions  Your physician has requested you wear a ZIO patch monitor for 14 days.  This is a single patch monitor. [employer] supplies one patch monitor per enrollment. Additional stickers are not available. Please do not apply patch if you will be having a Nuclear Stress Test,  Echocardiogram, Cardiac CT, MRI, or Chest Xray during the period you would be wearing the  monitor. The patch cannot be worn during these tests. You cannot remove and re-apply the  ZIO XT patch monitor.  Your ZIO patch monitor will be mailed 3 day USPS to your address on file. It may take 3-5 days  to receive your monitor after you have been enrolled.  Once you have received your monitor, please review the enclosed instructions. Your monitor  has already been registered assigning a specific monitor serial # to you.  Billing and Patient Assistance Program Information  We have supplied [employer] with any of your insurance information on file for billing purposes. [employer] offers a sliding scale Patient Assistance Program for patients that do not have  insurance, or whose insurance does not completely cover the cost of the ZIO monitor.  You must apply for the Patient Assistance Program to qualify for this discounted rate.  To apply, please call [employer] at [phone], select option 4, select option 2, ask to apply for  Patient Assistance Program. Meredeth Ide will ask your household income, and how many people  are in your household. They will quote your out-of-pocket  cost based on that information.  [employer] will also be able to set up a 43-month, interest-free payment plan if needed.  Applying the monitor   Shave hair from upper left chest.  Hold abrader disc by orange tab. Rub abrader in 40 strokes over the upper left chest as  indicated in your monitor instructions.  Clean area with 4 enclosed alcohol pads. Let dry.  Apply patch as indicated in monitor instructions. Patch will be placed under collarbone on left  side of chest with arrow pointing upward.  Rub patch adhesive wings for 2 minutes. Remove white label marked "1". Remove the white  label marked "2". Rub patch adhesive wings for 2 additional minutes.  While looking in a mirror, press and release button in center of patch. A small green light will  flash 3-4 times. This will be your only indicator that the monitor has been turned on.  Do not shower for the first 24 hours. You may shower after the first 24 hours.  Press the button if you feel a symptom. You will hear a small click. Record Date, Time and  Symptom in the Patient Logbook.  When you are ready to remove the patch, follow instructions on the last 2 pages of Patient  Logbook. Stick patch monitor onto the last page of Patient Logbook.  Place Patient Logbook in the blue and white box. Use locking tab on box and tape box closed  securely. The blue and white box has prepaid postage on it. Please place it in the mailbox as  soon as possible. Your physician should have your test results approximately 7 days after the  monitor has been mailed back to [facility].  Call [place] at [phone] if you have questions regarding  your ZIO XT patch monitor. Call them immediately if you see an orange light blinking on your  monitor.  If your monitor falls off in less than 4 days, contact our Monitor department at [phone].  If your monitor becomes loose or falls off after 4 days call [employer] at [phone] for   suggestions on securing your monitor   Follow-Up:  AS NEEDED WITH DR. Johney Frame  Important Information About Sugar          I,Alexis Herring,acting as a scribe for Freada Bergeron, MD.,have documented all relevant documentation on the behalf of Freada Bergeron, MD,as directed by  Freada Bergeron, MD while in the presence of Freada Bergeron, MD.  I, Freada Bergeron, MD, have reviewed all documentation for this visit. The documentation on 01/25/22 for the exam, diagnosis, procedures, and orders are all accurate and complete.   Signed, Freada Bergeron, MD  01/25/2022 9:12 AM    [place]

## 2022-01-25 NOTE — Patient Instructions (Signed)
Medication Instructions:   Your physician recommends that you continue on your current medications as directed. Please refer to the Current Medication list given to you today.  *If you need a refill on your cardiac medications before your next appointment, please call your pharmacy*   Testing/Procedures:  CARDIAC CALCIUM SCORE (SELF PAY)    ZIO XT- Long Term Monitor Instructions  Your physician has requested you wear a ZIO patch monitor for 14 days.  This is a single patch monitor. [employer] supplies one patch monitor per enrollment. Additional stickers are not available. Please do not apply patch if you will be having a Nuclear Stress Test,  Echocardiogram, Cardiac CT, MRI, or Chest Xray during the period you would be wearing the  monitor. The patch cannot be worn during these tests. You cannot remove and re-apply the  ZIO XT patch monitor.  Your ZIO patch monitor will be mailed 3 day USPS to your address on file. It may take 3-5 days  to receive your monitor after you have been enrolled.  Once you have received your monitor, please review the enclosed instructions. Your monitor  has already been registered assigning a specific monitor serial # to you.  Billing and Patient Assistance Program Information  We have supplied [employer] with any of your insurance information on file for billing purposes. [employer] offers a sliding scale Patient Assistance Program for patients that do not have  insurance, or whose insurance does not completely cover the cost of the ZIO monitor.  You must apply for the Patient Assistance Program to qualify for this discounted rate.  To apply, please call [employer] at [phone], select option 4, select option 2, ask to apply for  Patient Assistance Program. Lindsey Lin will ask your household income, and how many people  are in your household. They will quote your out-of-pocket cost based on that information.  [employer] will also be able to set up a 29-month,  interest-free payment plan if needed.  Applying the monitor   Shave hair from upper left chest.  Hold abrader disc by orange tab. Rub abrader in 40 strokes over the upper left chest as  indicated in your monitor instructions.  Clean area with 4 enclosed alcohol pads. Let dry.  Apply patch as indicated in monitor instructions. Patch will be placed under collarbone on left  side of chest with arrow pointing upward.  Rub patch adhesive wings for 2 minutes. Remove white label marked "1". Remove the white  label marked "2". Rub patch adhesive wings for 2 additional minutes.  While looking in a mirror, press and release button in center of patch. A small green light will  flash 3-4 times. This will be your only indicator that the monitor has been turned on.  Do not shower for the first 24 hours. You may shower after the first 24 hours.  Press the button if you feel a symptom. You will hear a small click. Record Date, Time and  Symptom in the Patient Logbook.  When you are ready to remove the patch, follow instructions on the last 2 pages of Patient  Logbook. Stick patch monitor onto the last page of Patient Logbook.  Place Patient Logbook in the blue and white box. Use locking tab on box and tape box closed  securely. The blue and white box has prepaid postage on it. Please place it in the mailbox as  soon as possible. Your physician should have your test results approximately 7 days after the  monitor has been  mailed back to [facility]  Call [facility] Customer Care at [phone] if you have questions regarding  your ZIO XT patch monitor. Call them immediately if you see an orange light blinking on your  monitor.  If your monitor falls off in less than 4 days, contact our Monitor department at [phone].  If your monitor becomes loose or falls off after 4 days call [employer] at [phone] for  suggestions on securing your monitor   Follow-Up:  AS NEEDED WITH DR.  Shari Prows  Important Information About Sugar

## 2022-01-25 NOTE — Telephone Encounter (Signed)
-----   Message from Flavia Shipper sent at 01/25/2022  8:56 AM EST ----- Regarding: RE: 14 DAY ZIO PER DR. Shari Prows Done ----- Message ----- From: Loa Socks, LPN Sent: 16/38/4536   8:55 AM EST To: Loa Socks, LPN; Shelly A Wells; # Subject: 14 DAY ZIO PER DR. PEMBERTON                   14 day zio ordered for palpitations  Please enroll   Thanks Leeyah Heather

## 2022-01-25 NOTE — Progress Notes (Unsigned)
Enrolled patient for a 14 day Zio XT  monitor to be mailed to patients home  °

## 2022-01-28 DIAGNOSIS — R002 Palpitations: Secondary | ICD-10-CM | POA: Diagnosis not present

## 2022-03-10 ENCOUNTER — Ambulatory Visit (HOSPITAL_BASED_OUTPATIENT_CLINIC_OR_DEPARTMENT_OTHER)
Admission: RE | Admit: 2022-03-10 | Discharge: 2022-03-10 | Disposition: A | Discharge status: Home / Self Care | Payer: BC Managed Care – PPO | Source: Ambulatory Visit | Attending: Cardiology | Admitting: Cardiology

## 2022-03-10 DIAGNOSIS — E785 Hyperlipidemia, unspecified: Secondary | ICD-10-CM | POA: Insufficient documentation

## 2022-05-05 IMAGING — MG MM DIGITAL SCREENING BILAT W/ TOMO AND CAD
6 of 10 series · 6 of 30 positions shown · non-contrast
Comparison: Previous exam(s).

CLINICAL DATA: Screening.

EXAM:
DIGITAL SCREENING BILATERAL MAMMOGRAM WITH TOMOSYNTHESIS AND CAD
TECHNIQUE: Bilateral screening digital craniocaudal and mediolateral oblique
mammograms were obtained. Bilateral screening digital breast
tomosynthesis was performed. The images were evaluated with
computer-aided detection.

[L CC synth-2D]
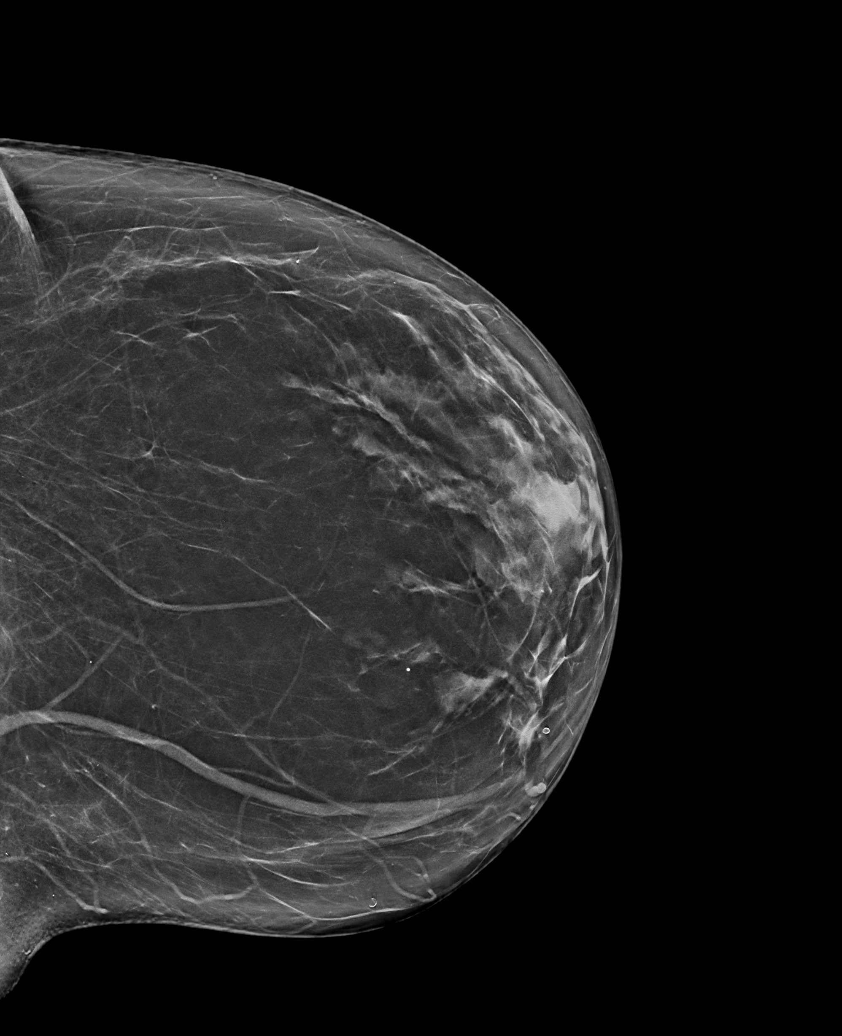

[R CC synth-2D]
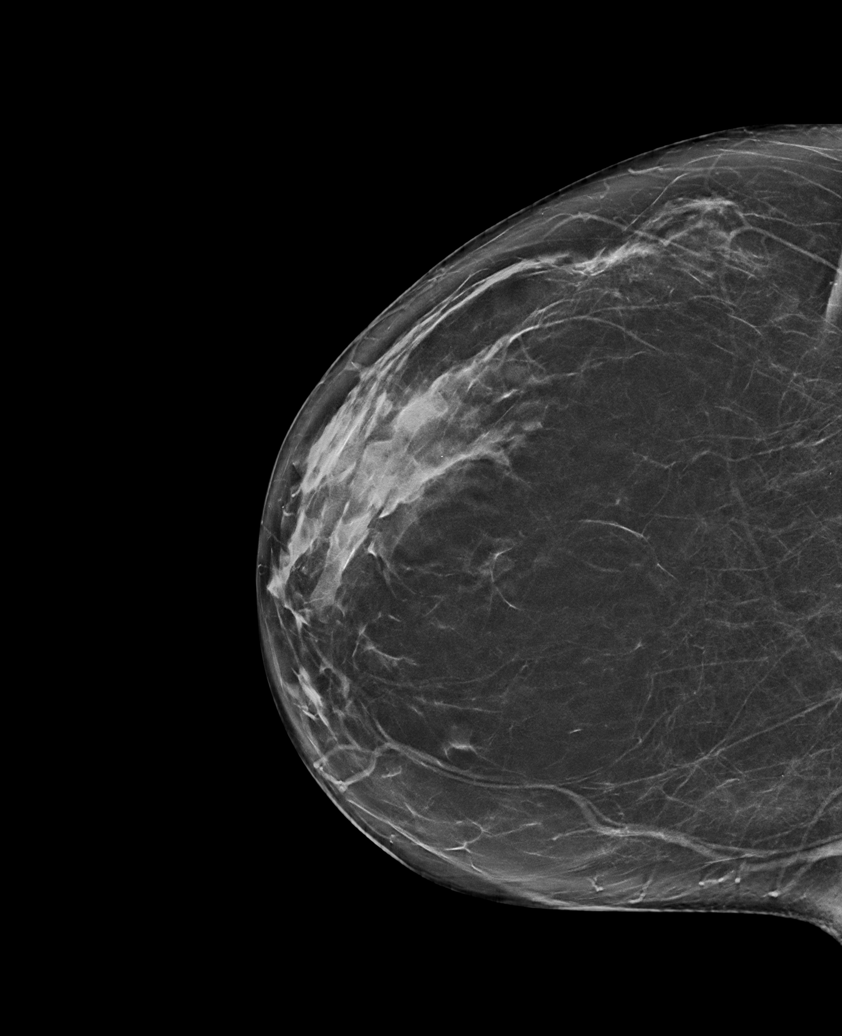

[R MLO synth-2D]
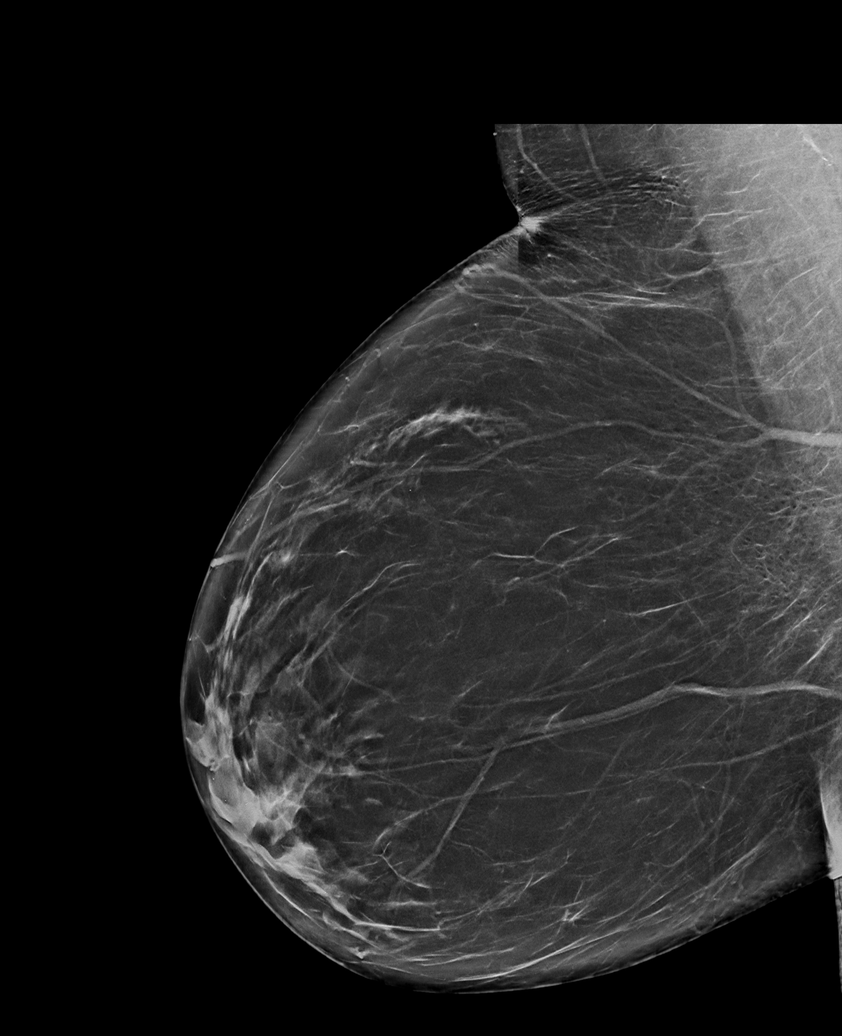

[L MLO synth-2D (1 of 2)]
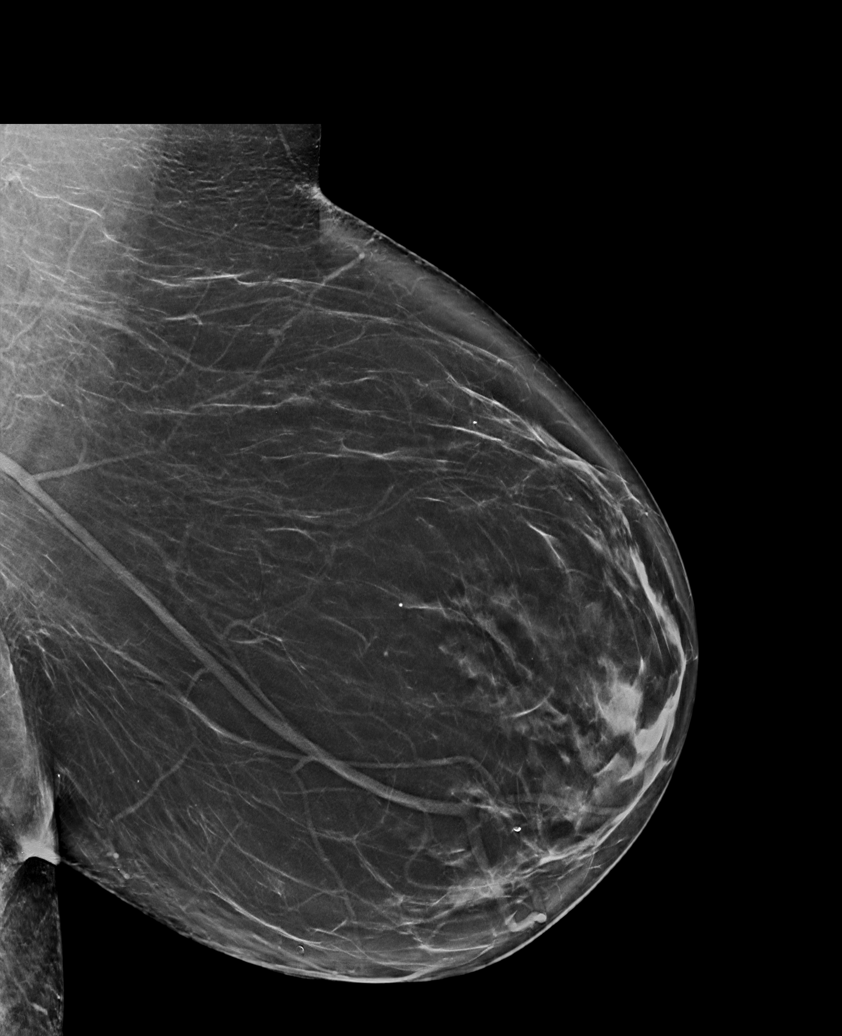

[L MLO synth-2D (2 of 2)]
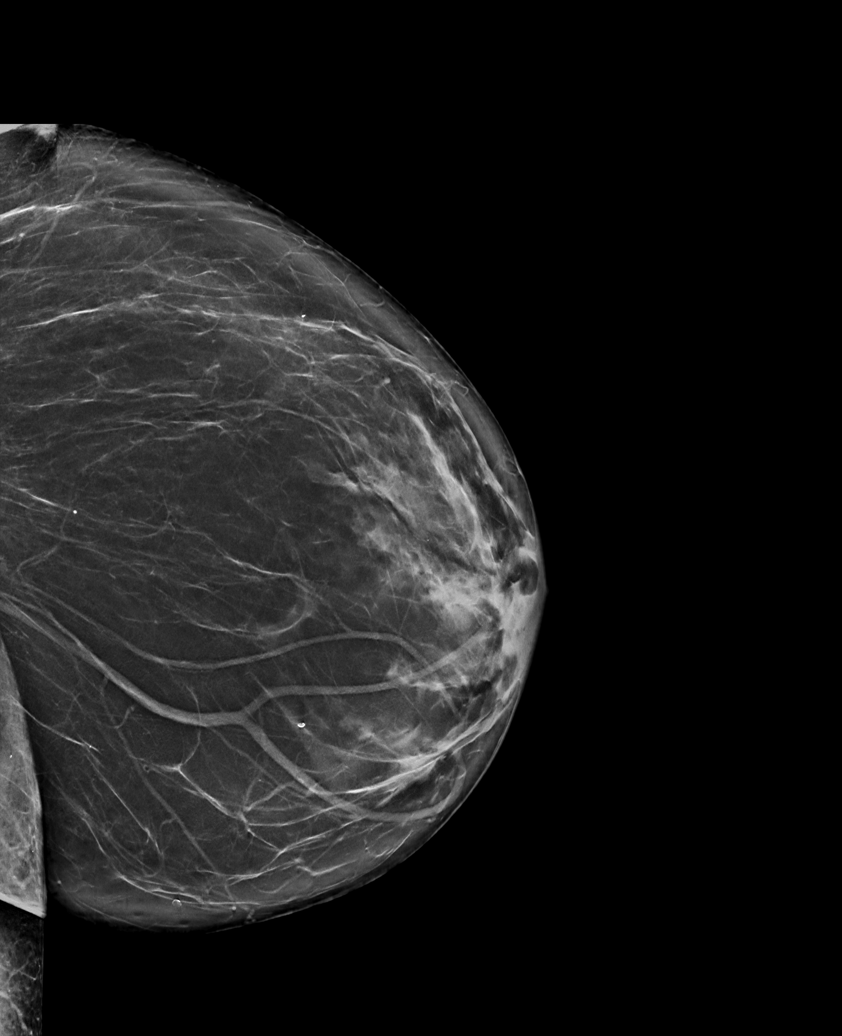

[L MLO tomo · tomo slice 45/88.0]
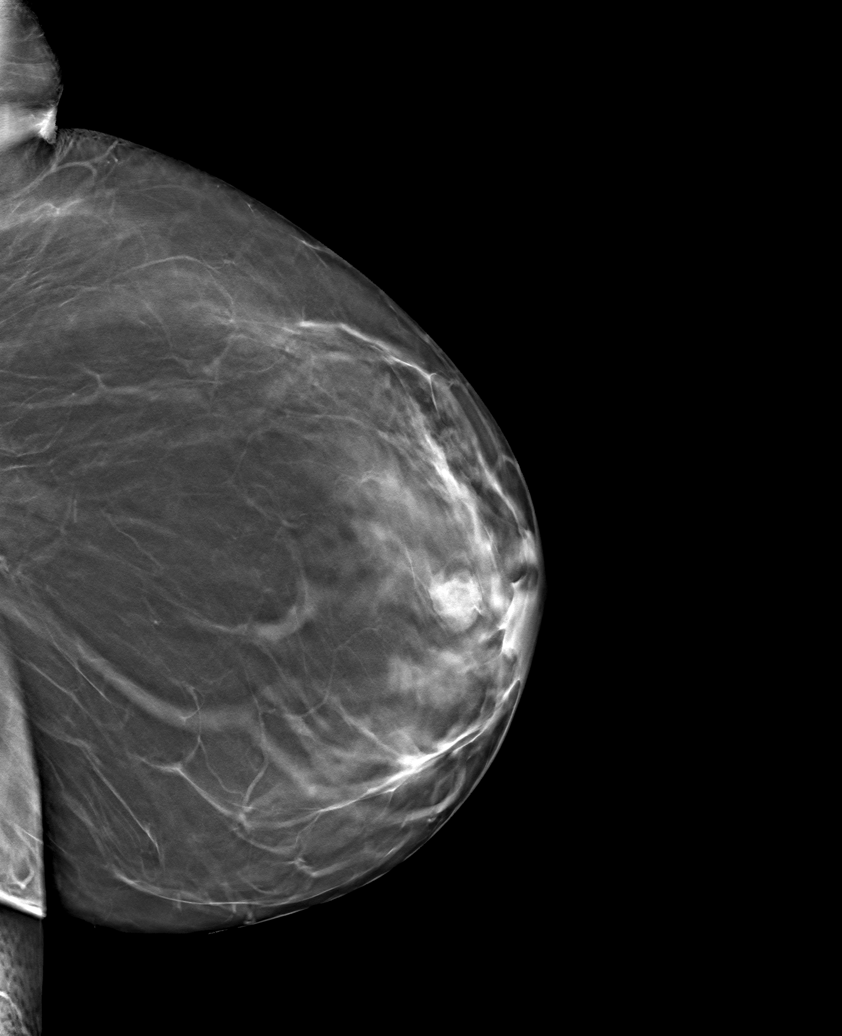

[6 of 30 positions shown; findings below may reference images not displayed]

ACR Breast Density Category c: The breast tissue is heterogeneously
dense, which may obscure small masses.
FINDINGS: There are no findings suspicious for malignancy.
IMPRESSION: No mammographic evidence of malignancy. A result letter of this
screening mammogram will be mailed directly to the patient.

RECOMMENDATION:
Screening mammogram in one year. (Code:Q3-W-BC3)

BI-RADS CATEGORY  1: Negative.

## 2022-06-09 ENCOUNTER — Other Ambulatory Visit: Payer: Self-pay | Admitting: Family Medicine

## 2022-06-09 DIAGNOSIS — Z1231 Encounter for screening mammogram for malignant neoplasm of breast: Secondary | ICD-10-CM

## 2022-07-28 ENCOUNTER — Ambulatory Visit
Admission: RE | Admit: 2022-07-28 | Discharge: 2022-07-28 | Disposition: A | Discharge status: Home / Self Care | Payer: BC Managed Care – PPO | Source: Ambulatory Visit | Attending: Family Medicine | Admitting: Family Medicine

## 2022-07-28 DIAGNOSIS — Z1231 Encounter for screening mammogram for malignant neoplasm of breast: Secondary | ICD-10-CM

## 2023-07-12 ENCOUNTER — Other Ambulatory Visit: Payer: Self-pay | Admitting: Student

## 2023-07-12 DIAGNOSIS — Z1231 Encounter for screening mammogram for malignant neoplasm of breast: Secondary | ICD-10-CM

## 2023-08-03 ENCOUNTER — Ambulatory Visit

## 2023-08-03 ENCOUNTER — Ambulatory Visit
Admission: RE | Admit: 2023-08-03 | Discharge: 2023-08-03 | Disposition: A | Discharge status: Home / Self Care | Source: Ambulatory Visit | Attending: Student | Admitting: Student

## 2023-08-03 DIAGNOSIS — Z1231 Encounter for screening mammogram for malignant neoplasm of breast: Secondary | ICD-10-CM
